# Patient Record
Sex: Female | Born: 1975 | Race: White | Hispanic: No | Smoking: Never smoker
Health system: Southern US, Community
[De-identification: ages and names within clinical notes are randomized; demographics above are authoritative.]

## PROBLEM LIST (undated history)

## (undated) DIAGNOSIS — IMO0002 Reserved for concepts with insufficient information to code with codable children: Secondary | ICD-10-CM

## (undated) DIAGNOSIS — G43909 Migraine, unspecified, not intractable, without status migrainosus: Secondary | ICD-10-CM

## (undated) HISTORY — DX: Migraine, unspecified, not intractable, without status migrainosus: G43.909

## (undated) HISTORY — PX: BREAST BIOPSY: SHX20

## (undated) HISTORY — DX: Reserved for concepts with insufficient information to code with codable children: IMO0002

---

## 2011-08-16 ENCOUNTER — Other Ambulatory Visit: Payer: Self-pay | Admitting: Orthopedic Surgery

## 2011-08-16 ENCOUNTER — Ambulatory Visit (HOSPITAL_BASED_OUTPATIENT_CLINIC_OR_DEPARTMENT_OTHER)
Admission: RE | Admit: 2011-08-16 | Discharge: 2011-08-16 | Disposition: A | Discharge status: Home / Self Care | Payer: BC Managed Care – PPO | Source: Ambulatory Visit | Attending: Orthopedic Surgery | Admitting: Orthopedic Surgery

## 2011-08-16 DIAGNOSIS — Z01812 Encounter for preprocedural laboratory examination: Secondary | ICD-10-CM | POA: Insufficient documentation

## 2011-08-16 DIAGNOSIS — L608 Other nail disorders: Secondary | ICD-10-CM | POA: Insufficient documentation

## 2011-08-16 DIAGNOSIS — G43909 Migraine, unspecified, not intractable, without status migrainosus: Secondary | ICD-10-CM | POA: Insufficient documentation

## 2011-08-16 DIAGNOSIS — K219 Gastro-esophageal reflux disease without esophagitis: Secondary | ICD-10-CM | POA: Insufficient documentation

## 2011-08-16 LAB — POCT HEMOGLOBIN-HEMACUE: Hemoglobin: 14.3 g/dL (ref 12.0–15.0)

## 2011-08-19 LAB — TISSUE CULTURE: Gram Stain: NONE SEEN

## 2011-08-19 LAB — WOUND CULTURE: Gram Stain: NONE SEEN

## 2011-08-19 NOTE — Op Note (Signed)
NAMEWILLOW, Garza NO.:  [id number]  MEDICAL RECORD NO.:  [id number]  LOCATION:                                 FACILITY:  PHYSICIAN:  Betha Loa, MD             DATE OF BIRTH:  DATE OF PROCEDURE:  08/16/2011 DATE OF DISCHARGE:                              OPERATIVE REPORT   PREOPERATIVE DIAGNOSIS:  Left thumb melanonychia.  POSTOP DIAGNOSIS:  Left thumb  melanonychia.  PROCEDURE:  Left thumb nail bed biopsy.  SURGEON:  Betha Loa, MD  ASSISTANT:  Cindee Salt, MD  ANESTHESIA:  General.  IV FLUIDS:  Per anesthesia flow sheet.  ESTIMATED BLOOD LOSS:  Minimal.  COMPLICATIONS:  None.  SPECIMENS:  Nail bed tissue to pathology.  CULTURES:  Nail plate to micro.  TOURNIQUET TIME:  16 minutes.  DISPOSITION:  Stable to PACU.  INDICATIONS:  Hannah Garza is a 35 year old left hand dominant female who has noted a 3-year history of discoloration of the left thumb nail. She was seen by Dermatology who referred her to me for nail bed biopsy. She has noted no changes in the nail other than the discoloration.  I discussed with Hannah Garza the potential causes of nail bed discoloration.  I recommended going to the operating room for a nail bed biopsy.  Risks, benefits, and alternatives of surgery were discussed including the risk of blood loss, infection, damage to nerves, vessels, tendons, ligaments, bone, failure of procedure, need for additional procedures, complications with wound healing, and nail deformity.  She voiced understanding of these risks and elected to proceed.  OPERATIVE COURSE:  After being identified preoperatively by myself, the patient and I agreed upon procedure and site of procedure.  The surgical site was marked.  The risks, benefits, and alternatives of surgery were reviewed and she wished to proceed.  Surgical consent has been signed. She was given 1 g of IV Ancef as preoperative antibiotic prophylaxis. She was transported  to the operating room and placed on the operating room table in supine position with the left upper extremity on arm board.  General anesthesia was induced by the anesthesiologist.  The left upper extremity was prepped and draped in normal sterile orthopedic fashion.  Surgical pause was performed between surgeons, anesthesia, and operating room staff, and all were in agreement as to the patient, procedure, and site of procedure.  Tourniquet proximal aspect of the extremity was inflated to 250 mmHg after exsanguination of the forearm and upper arm with an Esmarch bandage.  The hand was not wrapped.  The nail plate of the left thumb was removed using the Therapist, nutritional. There was discoloration to the nail plate itself.  There was a small area of approximately 1 x 3 mm of discoloration on the nail bed.  This did not go underneath the nail fold.  A Beaver blade was used to excise a longitudinal portion of the nail bed including discoloration.  This was sent to pathology for examination.  The nail plate and cultures were sent to micro for examination and for fungal culture.  The wound was copiously irrigated with sterile saline.  The nail bed was able to  be undermined and reapproximated.  A 6-0 chromic gut suture was used in an interrupted fashion to reapproximate the nail bed.  A piece of Xeroform was placed in the nail fold and the wound was dressed with sterile Xeroform.  It was then dressed with a sterile 2 x 2s and wrapped with Kling and a Coban dressing.  A digital block was performed with 10 mL of 0.25% plain Marcaine to aid in postoperative analgesia.  The tourniquet was deflated at 16 minutes.  Operative drapes were broken down, and the patient was awoken from anesthesia safely.  She was transferred back to the stretcher and taken to PACU in stable condition.  I will see her back in the office in 1 week for postoperative followup.  I will give her Percocet 5/325 1-2 p.o. q.6 h. p.r.n.  pain, dispensed #40.     Betha Loa, MD     KK/MEDQ  D:  08/16/2011  T:  08/16/2011  Job:  [id number]  Electronically Signed by Betha Loa  on 08/19/2011 10:28:40 AM

## 2011-08-21 LAB — ANAEROBIC CULTURE

## 2011-09-14 LAB — FUNGUS CULTURE W SMEAR: Fungal Smear: NONE SEEN

## 2011-09-28 LAB — AFB CULTURE WITH SMEAR (NOT AT ARMC)
Acid Fast Smear: NONE SEEN
Acid Fast Smear: NONE SEEN

## 2013-04-24 ENCOUNTER — Other Ambulatory Visit: Payer: Self-pay | Admitting: Obstetrics and Gynecology

## 2013-05-30 ENCOUNTER — Other Ambulatory Visit: Payer: Self-pay

## 2013-05-30 MED ORDER — TOPIRAMATE 50 MG PO TABS: ORAL_TABLET | ORAL | Status: DC

## 2013-05-30 MED ORDER — DICLOFENAC POTASSIUM(MIGRAINE) 50 MG PO PACK: 50.0000 mg | PACK | ORAL | Status: DC | PRN

## 2013-05-30 NOTE — Telephone Encounter (Signed)
Patient called requesting refills at her new pharmacy.

## 2014-02-27 ENCOUNTER — Telehealth: Payer: Self-pay | Admitting: *Deleted

## 2014-02-27 MED ORDER — TOPIRAMATE 50 MG PO TABS: ORAL_TABLET | ORAL | Status: DC

## 2014-02-27 NOTE — Telephone Encounter (Signed)
Rx has been sent, noting appt is needed.

## 2014-03-09 ENCOUNTER — Other Ambulatory Visit: Payer: Self-pay | Admitting: Diagnostic Neuroimaging

## 2014-05-22 ENCOUNTER — Ambulatory Visit (INDEPENDENT_AMBULATORY_CARE_PROVIDER_SITE_OTHER): Payer: BC Managed Care – PPO | Admitting: Diagnostic Neuroimaging

## 2014-05-22 ENCOUNTER — Encounter: Payer: Self-pay | Admitting: Diagnostic Neuroimaging

## 2014-05-22 ENCOUNTER — Encounter (INDEPENDENT_AMBULATORY_CARE_PROVIDER_SITE_OTHER): Payer: Self-pay

## 2014-05-22 VITALS — BP 122/85 | HR 77 | Temp 97.3°F | Ht 68.0 in | Wt 140.8 lb

## 2014-05-22 DIAGNOSIS — G43009 Migraine without aura, not intractable, without status migrainosus: Secondary | ICD-10-CM

## 2014-05-22 MED ORDER — TOPIRAMATE 50 MG PO TABS: 100.0000 mg | ORAL_TABLET | Freq: Two times a day (BID) | ORAL | Status: DC

## 2014-05-22 MED ORDER — DICLOFENAC POTASSIUM(MIGRAINE) 50 MG PO PACK: 50.0000 mg | PACK | ORAL | Status: DC | PRN

## 2014-05-22 NOTE — Patient Instructions (Signed)
Increase topiramate to 100mg twice a day. 

## 2014-05-22 NOTE — Progress Notes (Signed)
[facility]  PATIENT: Hannah Garza DOB: [birth year]  REFERRING CLINICIAN:  HISTORY FROM: patient (2 sons here for visit) REASON FOR VISIT: follow up   HISTORICAL  CHIEF COMPLAINT:  Chief Complaint  Patient presents with  . Follow-up    HA    HISTORY OF PRESENT ILLNESS:   UPDATE 05/22/14 (VRP): Since last visit, HA stable. Slightly worse in summer with hot weather. Overall having 4 HA per month. Uses cambia 1-2 packets per month. On TPX 50 / 100.   UPDATE 01/08/13 (VRP): Since last visit, doing slightly worse. Was better for a while, but lately incr HA up to 6 per month. Tried cambia sample which worked, but didn't get rx. More stress and insomnia lately.   UPDATE 08/27/12 (JM): Feels like headaches are better now having 2 headaches per month.  Tolerating topiramate 50mg  BID, 1st week had tingling and focus issues but improved just has tingling in fingers early in morning but not enough to have problems with "gripping" which improves later in the day. Also continues to take Excedrin migraine and Ibuprofen 2 days out of the month with relief.   Sumatriptan made her feel like her throat was closing, heart racing.      PRIOR HPI (06/11/12, VRP): 38 year old left-handed female with history of migraine, degenerative disc disease, here for evaluation of increasing headaches. Patient reports history of migraine headaches since teenage years. She describes 1-3 migraine headaches per year, consisting of left inner eye pain, spreading to the left forehead and left hemisphere, sometimes associated with photophobia, nausea, triggered by strong smells and loud sounds. She has family history of migraine headache in her mother. Over the past year, she's had increasing frequency and severity of headaches, now at least one migraine headache per week as well as a low level daily headache. Headaches seem to be worse right before her menstrual cycle. She's tried on oral contraceptive which  did not help. Typically she has tried ibuprofen, Tylenol, Excedrin Migraine, which previously helped her headaches, but no longer help. Patient also reports history of difficulty falling asleep as well as staying asleep. Ongoing psychosocial factors include mother-in-law and father-in-law who live with her since October 2011, as well as anticipated relocation to [place] in 2014.   REVIEW OF SYSTEMS: Full 14 system review of systems performed and notable only for headache insomnia blurred vision.  ALLERGIES: Allergies  Allergen Reactions  . Sumatriptan     HOME MEDICATIONS: Outpatient Prescriptions Prior to Visit  Medication Sig Dispense Refill  . Diclofenac Potassium (CAMBIA) 50 MG PACK Take 50 mg by mouth as needed.  9 each  6  . topiramate (TOPAMAX) 50 MG tablet One tab in the morning, two tabs in the evening  90 tablet  0  . topiramate (TOPAMAX) 50 MG tablet TAKE 1 TABLET BY MOUTH EVERY MORNING AND 2 TABLETS BY MOUTH EVERY EVENING  60 tablet  0   No facility-administered medications prior to visit.    PAST MEDICAL HISTORY: Past Medical History  Diagnosis Date  . Migraine   . DDD (degenerative disc disease)     PAST SURGICAL HISTORY: History reviewed. No pertinent past surgical history.  FAMILY HISTORY: Family History  Problem Relation Age of Onset  . Hypertension Mother   . Diabetes type II Father   . Hypertension Paternal Uncle     SOCIAL HISTORY:  History   Social History  . Marital Status: Married    Spouse Name: Nida Boatman    Number of Children:  2  . Years of Education: College   Occupational History  .  Other    Teacher Asst.    Social History Main Topics  . Smoking status: Never Smoker   . Smokeless tobacco: Never Used  . Alcohol Use: No  . Drug Use: No  . Sexual Activity: Not on file   Other Topics Concern  . Not on file   Social History Narrative   Patient lives at home with family.   Caffeine Use: occasionally     PHYSICAL EXAM  Filed Vitals:    05/22/14 1245  BP: 122/85  Pulse: 77  Temp: 97.3 F (36.3 C)  TempSrc: Oral  Height: 5\' 8"  (1.727 m)  Weight: 140 lb 12.8 oz (63.866 kg)    Not recorded    Body mass index is 21.41 kg/(m^2).  GENERAL EXAM: Patient is in no distress; well developed, nourished and groomed; neck is supple  CARDIOVASCULAR: Regular rate and rhythm, no murmurs, no carotid bruits  NEUROLOGIC: MENTAL STATUS: awake, alert, language fluent, comprehension intact, naming intact, fund of knowledge appropriate CRANIAL NERVE: no papilledema on fundoscopic exam, pupils equal and reactive to light, visual fields full to confrontation, extraocular muscles intact, no nystagmus, facial sensation and strength symmetric, hearing intact, palate elevates symmetrically, uvula midline, shoulder shrug symmetric, tongue midline. MOTOR: normal bulk and tone, full strength in the BUE, BLE SENSORY: normal and symmetric to light touch  COORDINATION: finger-nose-finger, fine finger movements normal REFLEXES: deep tendon reflexes present and symmetric GAIT/STATION: narrow based gait; able to walk tandem; romberg is negative    DIAGNOSTIC DATA (LABS, IMAGING, TESTING) - I reviewed patient records, labs, notes, testing and imaging myself where available.  Lab Results  Component Value Date   HGB 14.3 08/16/2011   No results found for this basename: na, k, cl, co2, glucose, bun, creatinine, calcium, prot, albumin, ast, alt, alkphos, bilitot, gfrnonaa, gfraa   No results found for this basename: CHOL, HDL, LDLCALC, LDLDIRECT, TRIG, CHOLHDL   No results found for this basename: HGBA1C   No results found for this basename: VITAMINB12   No results found for this basename: TSH    06/20/12 MRI brain - normal    ASSESSMENT AND PLAN  38 y.o. year old female here with migraine without aura.  PLAN: 1. increase TPX to 100mg  BID 2. Cambia prn breakthrough HA (allergic to triptans) 3. may consider amitriptyline in future  (due to anxiety, insomnia, HA)  Meds ordered this encounter  Medications  . topiramate (TOPAMAX) 50 MG tablet    Sig: Take 2 tablets (100 mg total) by mouth 2 (two) times daily.    Dispense:  120 tablet    Refill:  12  . Diclofenac Potassium (CAMBIA) 50 MG PACK    Sig: Take 50 mg by mouth as needed.    Dispense:  9 each    Refill:  6   Return in about 6 months (around 11/22/2014).    Suanne MarkerVIKRAM R. PENUMALLI, MD 05/22/2014, 1:47 PM Certified in Neurology, Neurophysiology and Neuroimaging  [facility] [address], [address] [place], [place] [address] [phone]

## 2014-06-16 ENCOUNTER — Other Ambulatory Visit: Payer: Self-pay | Admitting: Obstetrics and Gynecology

## 2014-06-17 LAB — CYTOLOGY - PAP

## 2014-11-26 ENCOUNTER — Ambulatory Visit: Payer: BC Managed Care – PPO | Admitting: Diagnostic Neuroimaging

## 2015-02-11 ENCOUNTER — Ambulatory Visit (INDEPENDENT_AMBULATORY_CARE_PROVIDER_SITE_OTHER): Payer: BLUE CROSS/BLUE SHIELD | Admitting: Diagnostic Neuroimaging

## 2015-02-11 ENCOUNTER — Encounter: Payer: Self-pay | Admitting: Diagnostic Neuroimaging

## 2015-02-11 VITALS — BP 128/90 | HR 70 | Ht 68.0 in | Wt 136.2 lb

## 2015-02-11 DIAGNOSIS — G43009 Migraine without aura, not intractable, without status migrainosus: Secondary | ICD-10-CM | POA: Diagnosis not present

## 2015-02-11 MED ORDER — TOPIRAMATE 50 MG PO TABS: 100.0000 mg | ORAL_TABLET | Freq: Two times a day (BID) | ORAL | Status: DC

## 2015-02-11 MED ORDER — DICLOFENAC POTASSIUM(MIGRAINE) 50 MG PO PACK: 50.0000 mg | PACK | ORAL | Status: AC | PRN

## 2015-02-11 NOTE — Patient Instructions (Signed)
Continue current medications. 

## 2015-02-11 NOTE — Progress Notes (Signed)
[facility]  PATIENT: Hannah Garza DOB: 1976-01-12  REFERRING CLINICIAN:  HISTORY FROM: patient REASON FOR VISIT: follow up   HISTORICAL  CHIEF COMPLAINT:  Chief Complaint  Patient presents with  . Follow-up    migraine    HISTORY OF PRESENT ILLNESS:   UPDATE 02/11/15 (VRP): Since last visit, was doing better on TPX  BID, but now going through divorce and custody battle since January 07, 2015, and subsequent increase in headaches.   UPDATE 05/22/14 (VRP): Since last visit, HA stable. Slightly worse in summer with hot weather. Overall having 4 HA per month. Uses cambia 1-2 packets per month. On TPX 50 / 100.   UPDATE 01/08/13 (VRP): Since last visit, doing slightly worse. Was better for a while, but lately incr HA up to 6 per month. Tried cambia sample which worked, but didn't get rx. More stress and insomnia lately.   UPDATE 08/27/12 (JM): Feels like headaches are better now having 2 headaches per month.  Tolerating topiramate  BID, 1st week had tingling and focus issues but improved just has tingling in fingers early in morning but not enough to have problems with "gripping" which improves later in the day. Also continues to take Excedrin migraine and Ibuprofen 2 days out of the month with relief.   Sumatriptan made her feel like her throat was closing, heart racing.      PRIOR HPI (06/11/12, VRP): 39 year old left-handed female with history of migraine, degenerative disc disease, here for evaluation of increasing headaches. Patient reports history of migraine headaches since teenage years. She describes 1-3 migraine headaches per year, consisting of left inner eye pain, spreading to the left forehead and left hemisphere, sometimes associated with photophobia, nausea, triggered by strong smells and loud sounds. She has family history of migraine headache in her mother. Over the past year, she's had increasing frequency and severity of headaches, now at least  one migraine headache per week as well as a low level daily headache. Headaches seem to be worse right before her menstrual cycle. She's tried on oral contraceptive which did not help. Typically she has tried ibuprofen, Tylenol, Excedrin Migraine, which previously helped her headaches, but no longer help. Patient also reports history of difficulty falling asleep as well as staying asleep. Ongoing psychosocial factors include mother-in-law and father-in-law who live with her since October 2011, as well as anticipated relocation to [place] in 2014.   REVIEW OF SYSTEMS: Full 14 system review of systems performed and notable only for agitation decr concentration depression insomnia freq waking.    ALLERGIES: Allergies  Allergen Reactions  . Sumatriptan     HOME MEDICATIONS: Outpatient Prescriptions Prior to Visit  Medication Sig Dispense Refill  . Diclofenac Potassium (CAMBIA) 50 MG PACK Take 50 mg by mouth as needed. 9 each 6  . topiramate (TOPAMAX) 50 MG tablet Take 2 tablets (100 mg total) by mouth 2 (two) times daily. 120 tablet 12   No facility-administered medications prior to visit.    PAST MEDICAL HISTORY: Past Medical History  Diagnosis Date  . Migraine   . DDD (degenerative disc disease)     PAST SURGICAL HISTORY: History reviewed. No pertinent past surgical history.  FAMILY HISTORY: Family History  Problem Relation Age of Onset  . Hypertension Mother   . Diabetes type II Father   . Hypertension Paternal Uncle     SOCIAL HISTORY:  History   Social History  . Marital Status: Married    Spouse Name: Nida Boatman  .  Number of Children: 2  . Years of Education: College   Occupational History  .  Other    Teacher Asst.    Social History Main Topics  . Smoking status: Never Smoker   . Smokeless tobacco: Never Used  . Alcohol Use: No  . Drug Use: No  . Sexual Activity: Not on file   Other Topics Concern  . Not on file   Social History Narrative   Patient lives at  home with family.   Caffeine Use: occasionally     PHYSICAL EXAM  Filed Vitals:   02/11/15 1518  BP: 128/90  Pulse: 70  Height: 5\' 8"  (1.727 m)  Weight: 136 lb 3.2 oz (61.78 kg)    Not recorded      Body mass index is 20.71 kg/(m^2).  GENERAL EXAM: Patient is in no distress; well developed, nourished and groomed; neck is supple; TEARFUL  CARDIOVASCULAR: Regular rate and rhythm, no murmurs, no carotid bruits  NEUROLOGIC: MENTAL STATUS: awake, alert, language fluent, comprehension intact, naming intact, fund of knowledge appropriate CRANIAL NERVE: no papilledema on fundoscopic exam, pupils equal and reactive to light, visual fields full to confrontation, extraocular muscles intact, no nystagmus, facial sensation and strength symmetric, hearing intact, palate elevates symmetrically, uvula midline, shoulder shrug symmetric, tongue midline. MOTOR: normal bulk and tone, full strength in the BUE, BLE SENSORY: normal and symmetric to light touch  COORDINATION: finger-nose-finger, fine finger movements normal REFLEXES: deep tendon reflexes present and symmetric GAIT/STATION: narrow based gait     DIAGNOSTIC DATA (LABS, IMAGING, TESTING) - I reviewed patient records, labs, notes, testing and imaging myself where available.  Lab Results  Component Value Date   HGB 14.3 08/16/2011   No results found for: NA No results found for: CHOL No results found for: HGBA1C No results found for: VITAMINB12 No results found for: TSH  06/20/12 MRI brain - normal    ASSESSMENT AND PLAN  39 y.o. year old female here with migraine without aura.  PLAN: 1. Continue TPX 100mg  BID 2. Continue Cambia prn breakthrough HA (allergic to triptans)  Return in about 6 months (around 08/13/2015).    Suanne MarkerVIKRAM R. PENUMALLI, MD 02/11/2015, 4:02 PM Certified in Neurology, Neurophysiology and Neuroimaging  [facility] [address], [address] [place], [place] [address] [phone]  [phone]

## 2015-05-24 ENCOUNTER — Other Ambulatory Visit: Payer: Self-pay | Admitting: Diagnostic Neuroimaging

## 2015-07-23 ENCOUNTER — Other Ambulatory Visit: Payer: Self-pay | Admitting: Obstetrics and Gynecology

## 2015-07-24 LAB — CYTOLOGY - PAP

## 2016-02-22 ENCOUNTER — Other Ambulatory Visit: Payer: Self-pay | Admitting: Diagnostic Neuroimaging

## 2016-08-22 ENCOUNTER — Other Ambulatory Visit: Payer: Self-pay | Admitting: Obstetrics and Gynecology

## 2016-08-22 DIAGNOSIS — R928 Other abnormal and inconclusive findings on diagnostic imaging of breast: Secondary | ICD-10-CM

## 2016-08-26 ENCOUNTER — Ambulatory Visit
Admission: RE | Admit: 2016-08-26 | Discharge: 2016-08-26 | Disposition: A | Discharge status: Home / Self Care | Payer: BC Managed Care – PPO | Source: Ambulatory Visit | Attending: Obstetrics and Gynecology | Admitting: Obstetrics and Gynecology

## 2016-08-26 DIAGNOSIS — R928 Other abnormal and inconclusive findings on diagnostic imaging of breast: Secondary | ICD-10-CM

## 2016-09-07 ENCOUNTER — Other Ambulatory Visit: Payer: Self-pay | Admitting: Obstetrics and Gynecology

## 2016-09-07 DIAGNOSIS — N6310 Unspecified lump in the right breast, unspecified quadrant: Secondary | ICD-10-CM

## 2016-09-15 ENCOUNTER — Ambulatory Visit
Admission: RE | Admit: 2016-09-15 | Discharge: 2016-09-15 | Disposition: A | Discharge status: Home / Self Care | Payer: BC Managed Care – PPO | Source: Ambulatory Visit | Attending: Obstetrics and Gynecology | Admitting: Obstetrics and Gynecology

## 2016-09-15 DIAGNOSIS — N6310 Unspecified lump in the right breast, unspecified quadrant: Secondary | ICD-10-CM

## 2020-08-11 ENCOUNTER — Other Ambulatory Visit: Payer: Self-pay | Admitting: Obstetrics and Gynecology

## 2020-08-11 DIAGNOSIS — R928 Other abnormal and inconclusive findings on diagnostic imaging of breast: Secondary | ICD-10-CM

## 2020-08-19 ENCOUNTER — Other Ambulatory Visit: Payer: Self-pay

## 2020-08-19 ENCOUNTER — Ambulatory Visit
Admission: RE | Admit: 2020-08-19 | Discharge: 2020-08-19 | Disposition: A | Discharge status: Home / Self Care | Payer: BC Managed Care – PPO | Source: Ambulatory Visit | Attending: Obstetrics and Gynecology | Admitting: Obstetrics and Gynecology

## 2020-08-19 ENCOUNTER — Other Ambulatory Visit: Payer: Self-pay | Admitting: Obstetrics and Gynecology

## 2020-08-19 DIAGNOSIS — N6320 Unspecified lump in the left breast, unspecified quadrant: Secondary | ICD-10-CM

## 2020-08-19 DIAGNOSIS — R928 Other abnormal and inconclusive findings on diagnostic imaging of breast: Secondary | ICD-10-CM

## 2020-09-01 ENCOUNTER — Ambulatory Visit
Admission: RE | Admit: 2020-09-01 | Discharge: 2020-09-01 | Disposition: A | Discharge status: Home / Self Care | Payer: BC Managed Care – PPO | Source: Ambulatory Visit | Attending: Obstetrics and Gynecology | Admitting: Obstetrics and Gynecology

## 2020-09-01 ENCOUNTER — Other Ambulatory Visit: Payer: Self-pay

## 2020-09-01 DIAGNOSIS — N6320 Unspecified lump in the left breast, unspecified quadrant: Secondary | ICD-10-CM

## 2022-09-25 IMAGING — MG MM DIGITAL DIAGNOSTIC UNILAT*L* W/ TOMO W/ CAD
4 series · 4 of 12 positions shown · non-contrast
Comparison: Previous exam(s).

CLINICAL DATA: Screening recall for a possible left breast mass.

EXAM:
DIGITAL DIAGNOSTIC LEFT MAMMOGRAM WITH CAD AND TOMO
ULTRASOUND LEFT BREAST

[L MLO synth-2D]
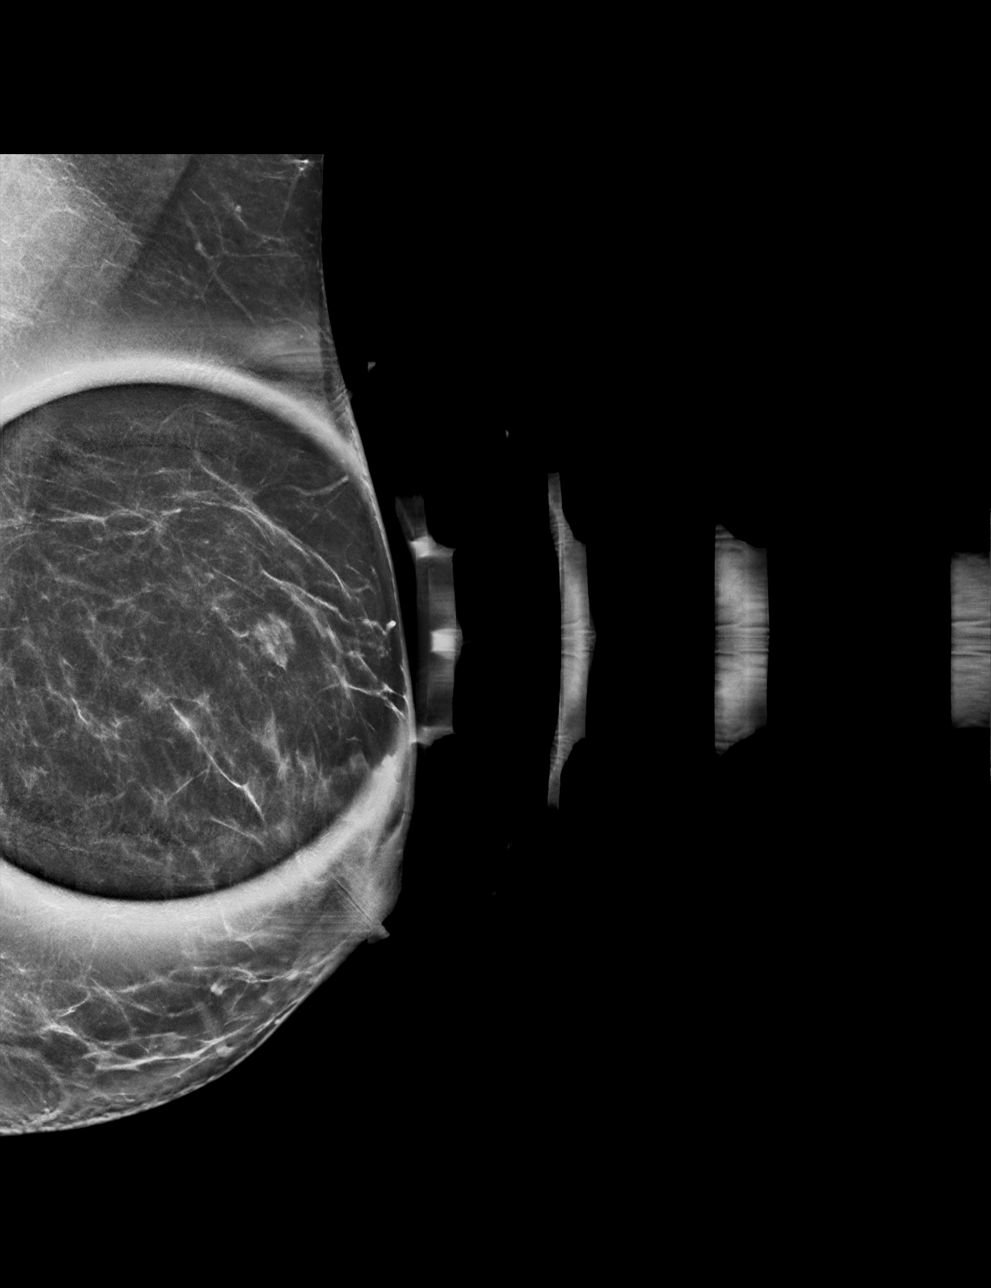

[L CC synth-2D]
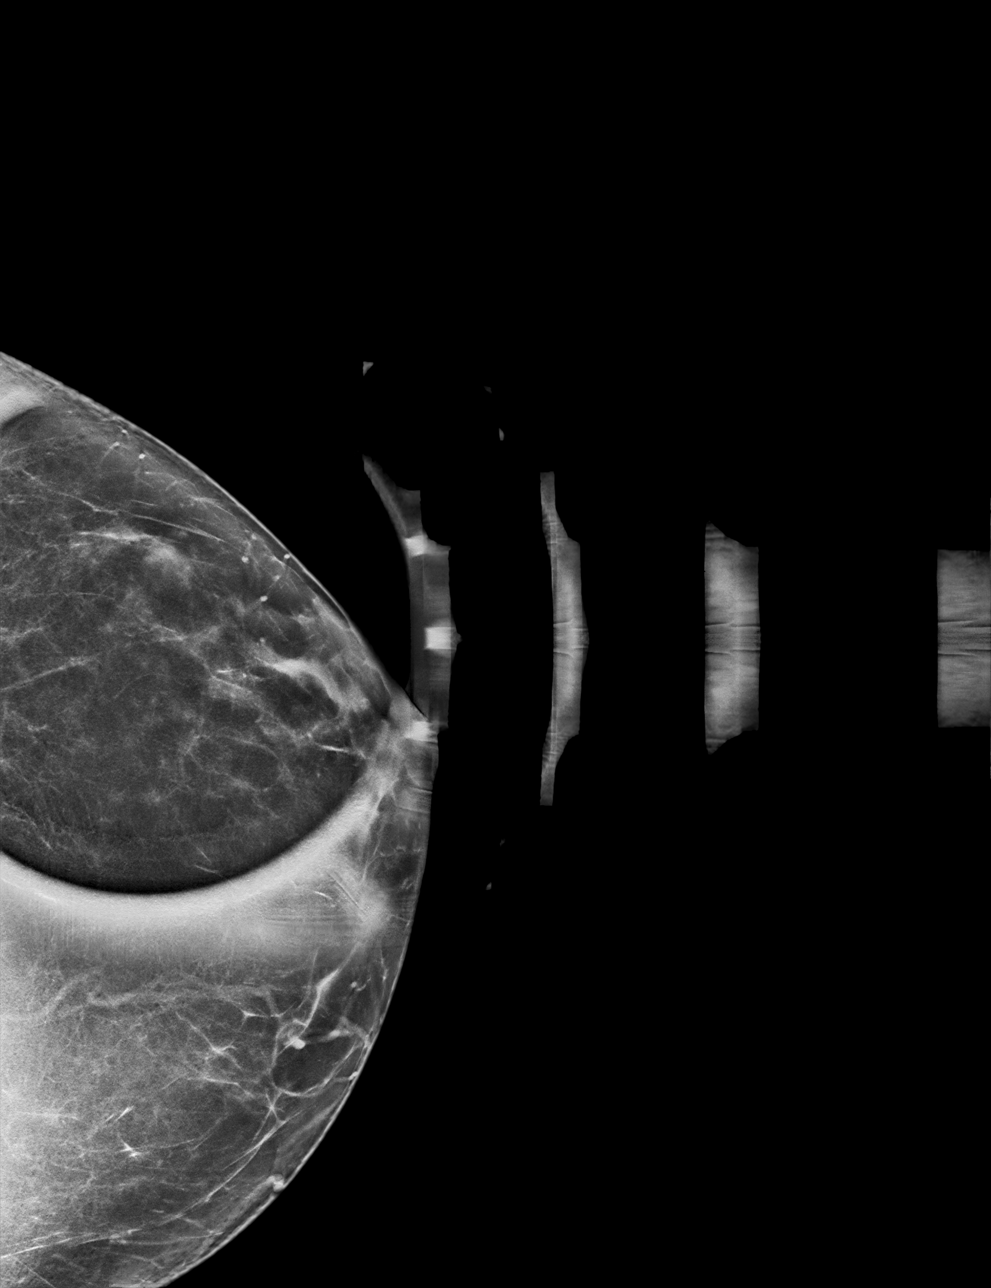

[L CC tomo · tomo slice 34/67.0]
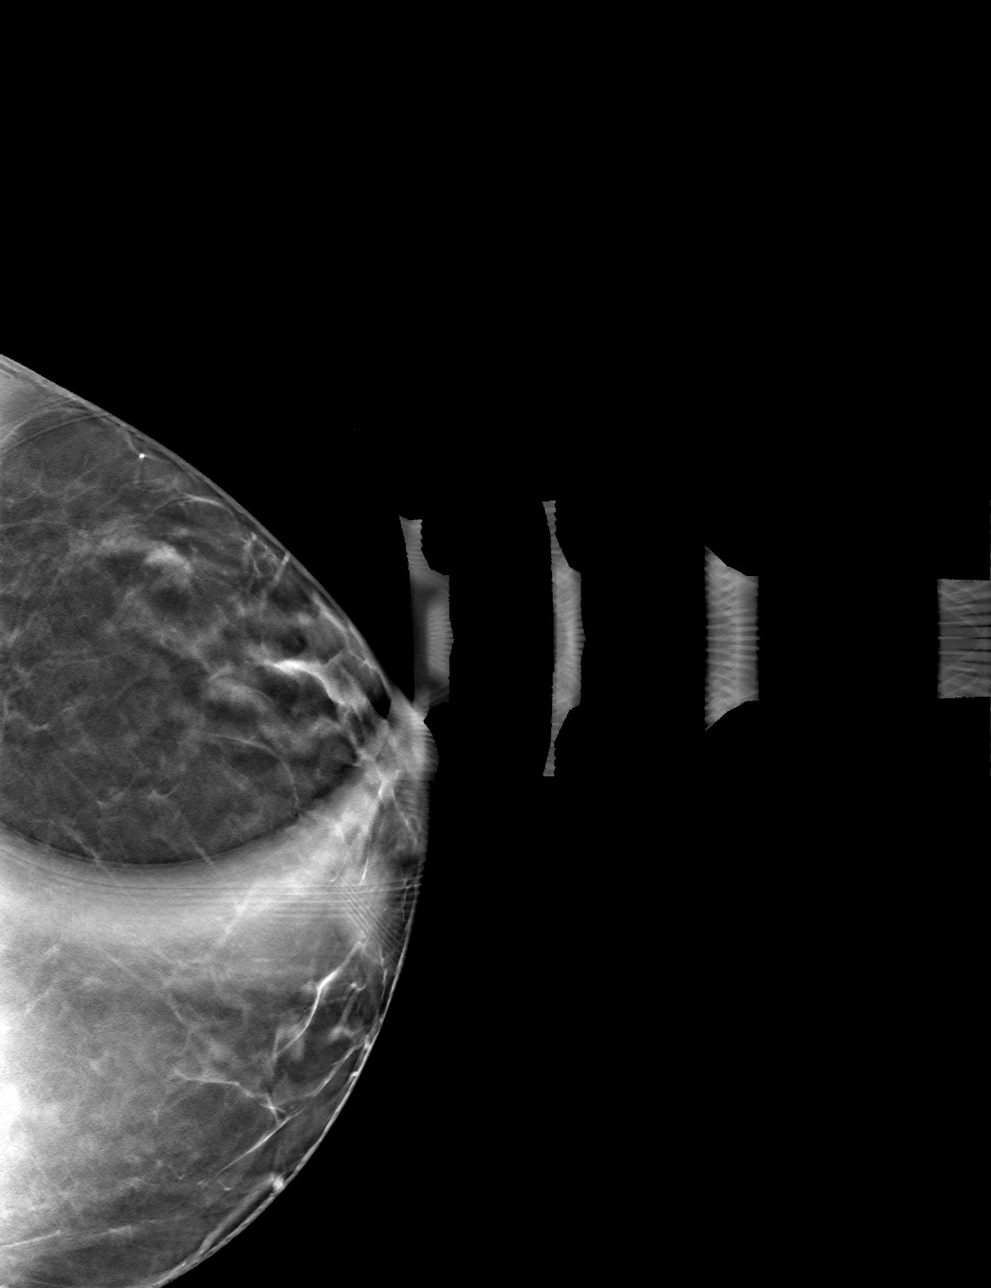

[L MLO tomo · tomo slice 37/74.0]
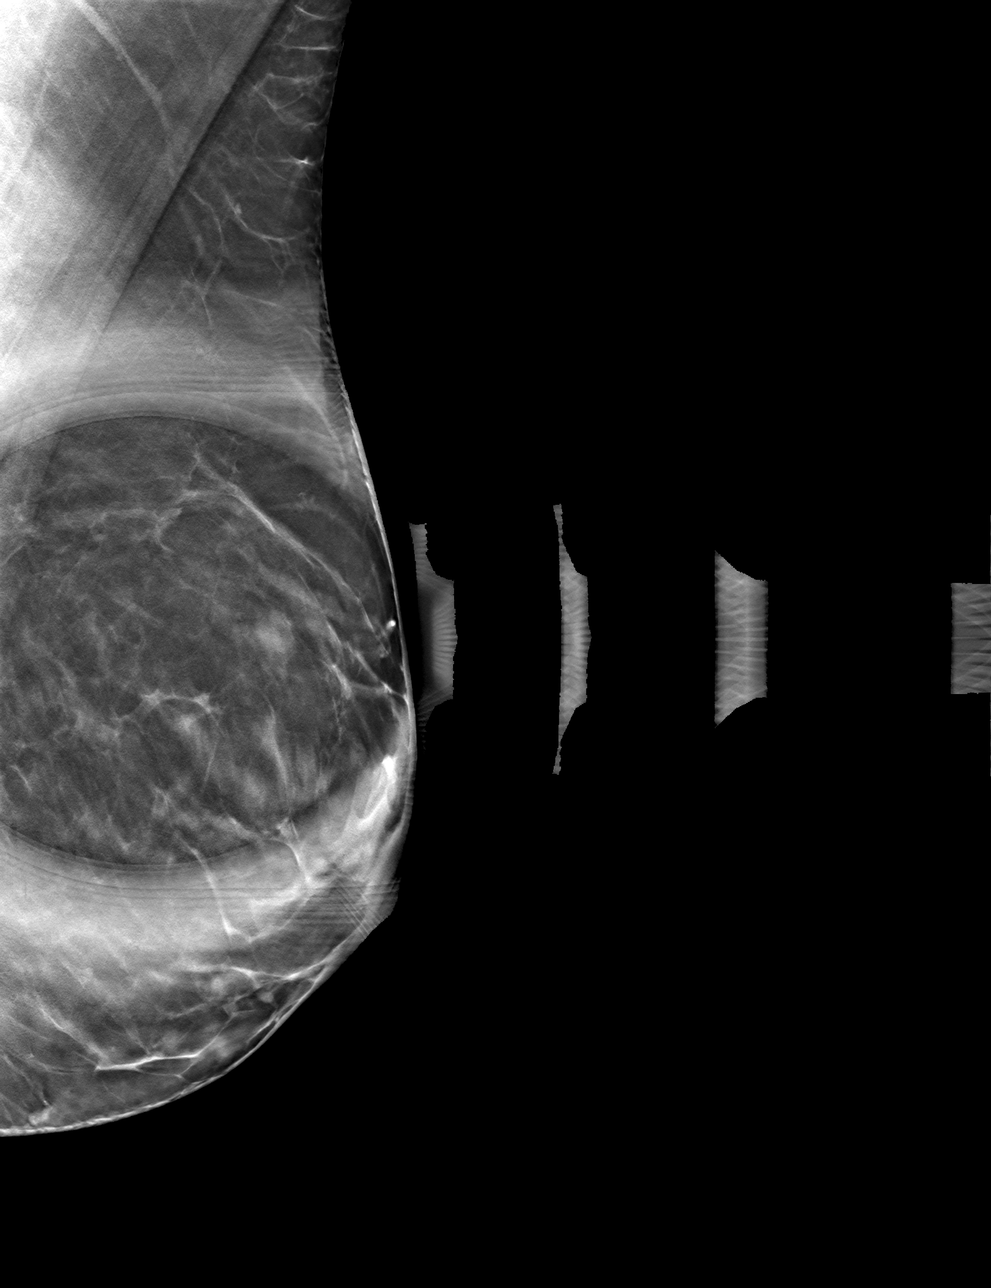

[4 of 12 positions shown; findings below may reference images not displayed]

ACR Breast Density Category b: There are scattered areas of
fibroglandular density.
FINDINGS: On the diagnostic images, the possible mass the lateral left breast
persists, but appears as a mixed attenuation mass, poorly defined on
the spot-compression cc view, better defined on the spot-compression
MLO view. The lesion measures approximately 9 mm in long axis.

Mammographic images were processed with CAD.

On physical exam, no mass is palpated in the lateral or upper outer
left breast.

Targeted ultrasound is performed, showing a focal lesion of mixed
echogenicity, which appears to be a focal area of mildly dilated
ducts with intervening hyperechoic tissue. This lies at 3 o'clock, 2
cm the nipple, middle depth, measuring approximately 10 x 4 x 7 mm
in size. This is consistent in size, shape and location to the
mammographic abnormality.

Sonographic evaluation of the left axilla shows no enlarged or
abnormal lymph nodes.
IMPRESSION: 1. Indeterminate, 1 cm, focal lesion in the 3 o'clock position of
the left breast, which has an appearance consistent with a focal
area mildly dilated ducts or a fibrocystic lesion. Malignancy is not
excluded.

RECOMMENDATION:
1. Ultrasound-guided core needle biopsy of the lesion at 3 o'clock
in the left breast. This procedure was scheduled prior to the
patient being discharged from the [REDACTED].

I have discussed the findings and recommendations with the patient.
If applicable, a reminder letter will be sent to the patient
regarding the next appointment.

BI-RADS CATEGORY  4: Suspicious.

## 2022-10-08 IMAGING — US US BREAST BX W LOC DEV 1ST LESION IMG BX SPEC US GUIDE*L*
1 series · 10 of 10 positions shown · non-contrast
Comparison: Previous exam(s).
COMPARISON: Previous exam(s).

Addendum:
CLINICAL DATA: 44-year-old female with an indeterminate left breast
mass.

EXAM:
ULTRASOUND GUIDED LEFT BREAST CORE NEEDLE BIOPSY

[Series 1: us breast bx w loc dev 1st lesion img bx spec us g · 0.06mm/px · 10 of 10 slices shown]
[im 1/10]
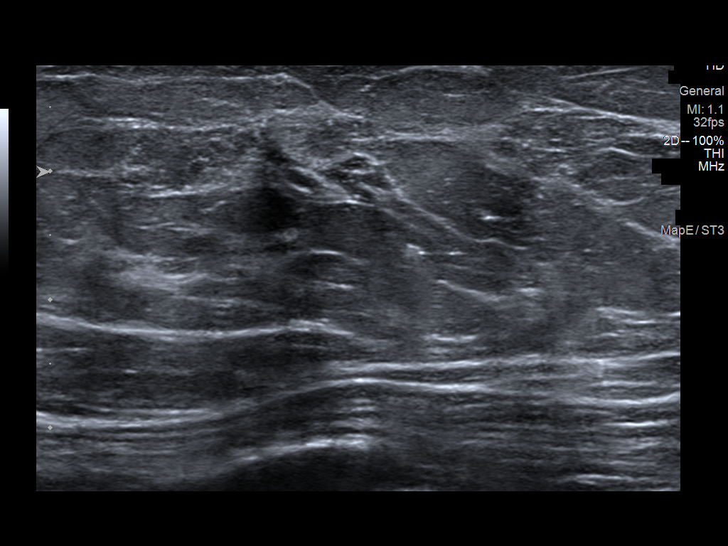
[im 2/10]
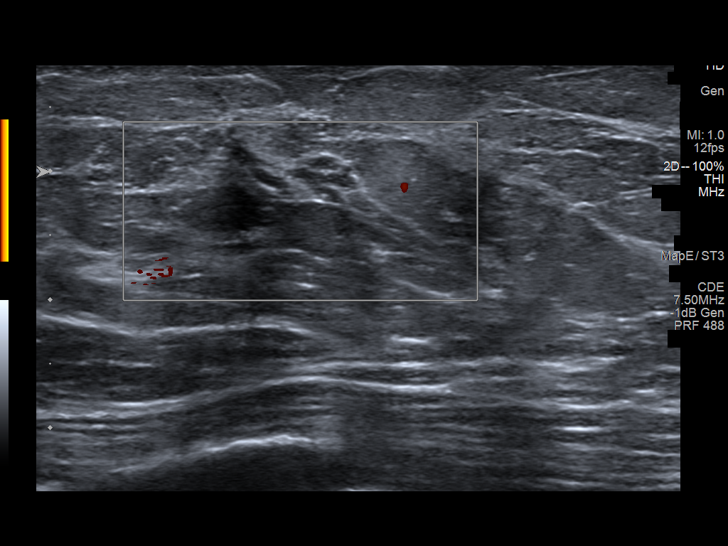
[im 3/10]
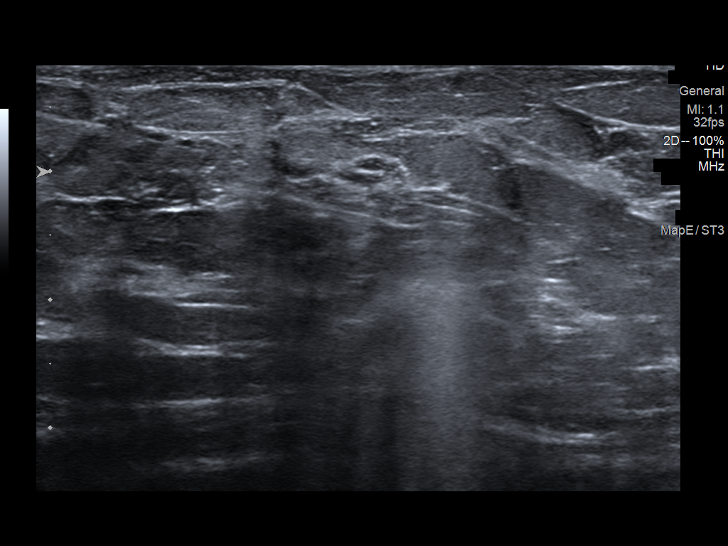
[im 4/10]
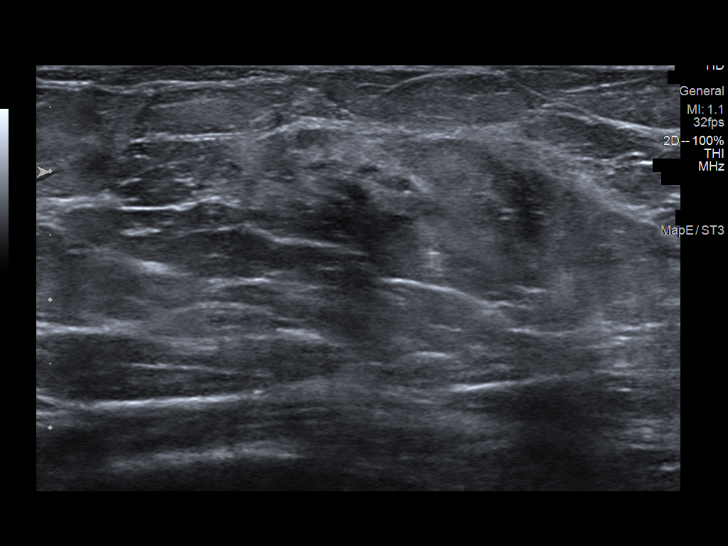
[im 5/10]
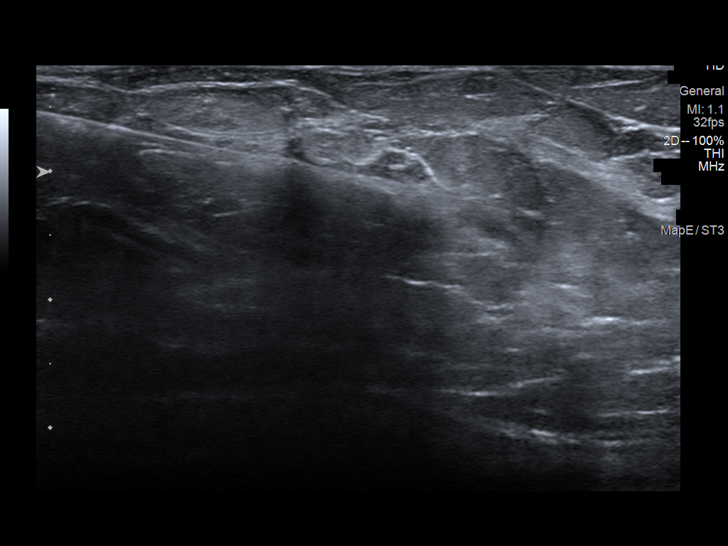
[im 6/10]
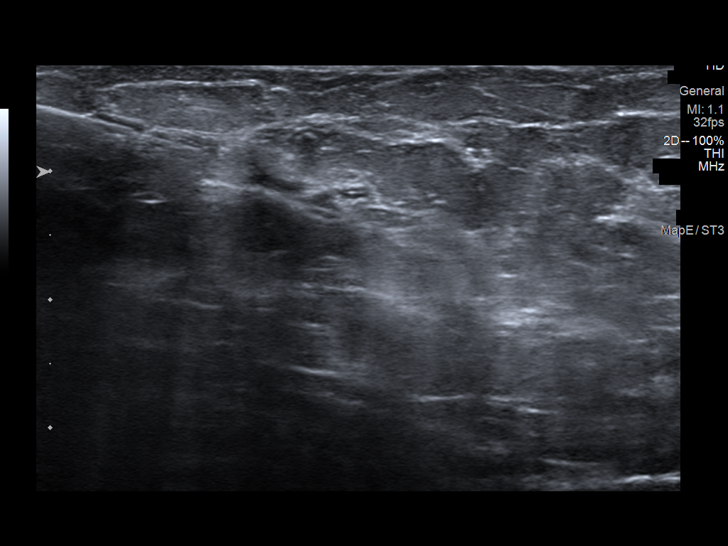
[im 7/10]
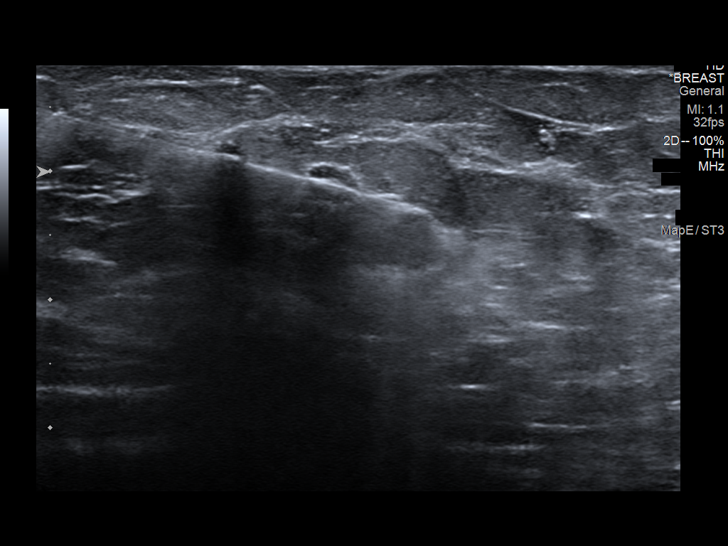
[im 8/10]
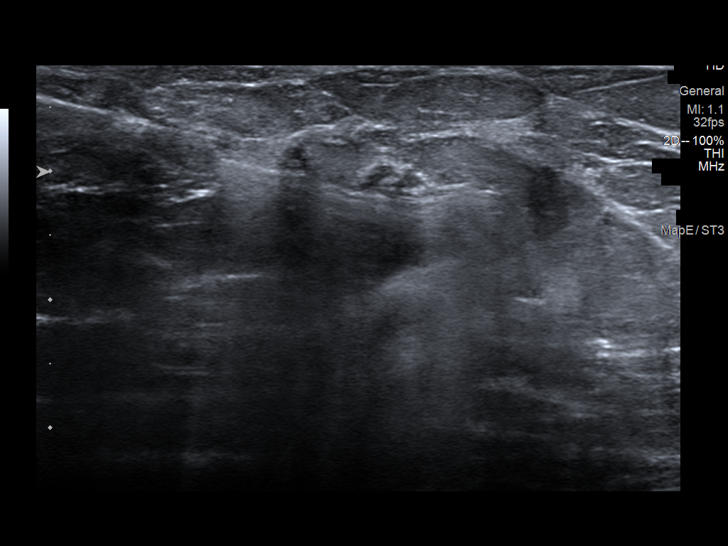
[im 9/10]
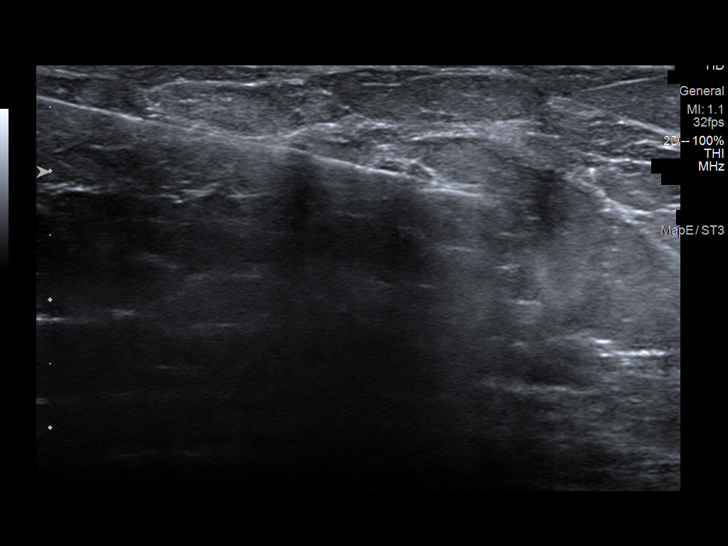
[im 10/10]
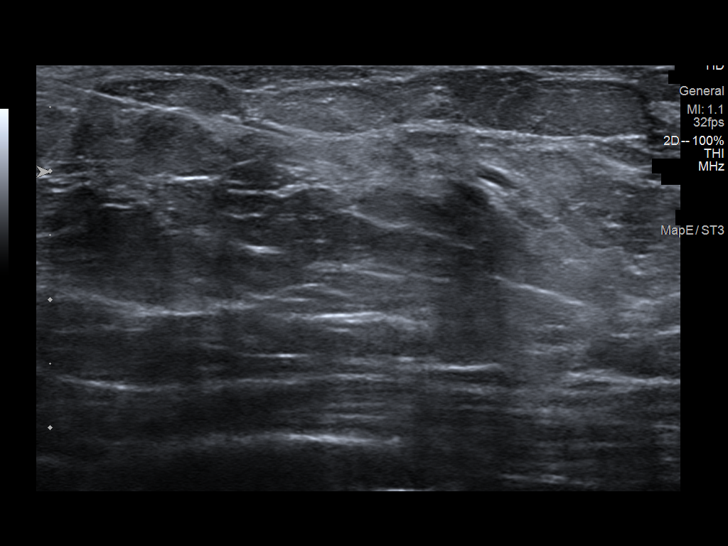

[10 of 10 positions shown; findings below may reference images not displayed]



Lesion quadrant: Upper outer quadrant

Using sterile technique and 1% Lidocaine as local anesthetic, under
direct ultrasound visualization, a 12 gauge Boland device was
used to perform biopsy of a heterogenous mass at the 3 o'clock
position using a inferior approach. At the conclusion of the
procedure ribbon shaped tissue marker clip was deployed into the
biopsy cavity. Follow up 2 view mammogram was performed and dictated
separately.
IMPRESSION: Ultrasound guided biopsy of the left breast. No apparent
complications.

ADDENDUM:
Pathology revealed COLUMNAR CELL CHANGES AND PSEUDOANGIOMATOUS
STROMAL HYPERPLASIA of the Left breast, upper outer. This was found
to be concordant by Dr. Rudi Jumper.

Pathology results were discussed with the patient by telephone. The
patient reported doing well after the biopsy with tenderness at the
site. Post biopsy instructions and care were reviewed and questions
were answered. The patient was encouraged to call The [REDACTED]

The patient was instructed to return for annual screening

Pathology results reported by Hecor Adhemar, RN on 09/02/2020.



Lesion quadrant: Upper outer quadrant

Using sterile technique and 1% Lidocaine as local anesthetic, under
direct ultrasound visualization, a 12 gauge Boland device was
used to perform biopsy of a heterogenous mass at the 3 o'clock
position using a inferior approach. At the conclusion of the
procedure ribbon shaped tissue marker clip was deployed into the
biopsy cavity. Follow up 2 view mammogram was performed and dictated
separately.
IMPRESSION: Ultrasound guided biopsy of the left breast. No apparent
complications.

## 2023-01-15 LAB — EXTERNAL GENERIC LAB PROCEDURE

## 2023-12-27 ENCOUNTER — Other Ambulatory Visit: Payer: Self-pay | Admitting: Obstetrics and Gynecology

## 2023-12-27 DIAGNOSIS — R928 Other abnormal and inconclusive findings on diagnostic imaging of breast: Secondary | ICD-10-CM

## 2024-01-01 ENCOUNTER — Encounter: Payer: Self-pay | Admitting: Obstetrics and Gynecology

## 2024-01-01 ENCOUNTER — Ambulatory Visit
Admission: RE | Admit: 2024-01-01 | Discharge: 2024-01-01 | Discharge status: Home / Self Care | Payer: 59 | Source: Ambulatory Visit | Attending: Obstetrics and Gynecology | Admitting: Obstetrics and Gynecology

## 2024-01-01 ENCOUNTER — Ambulatory Visit
Admission: RE | Admit: 2024-01-01 | Discharge: 2024-01-01 | Disposition: A | Discharge status: Home / Self Care | Payer: 59 | Source: Ambulatory Visit | Attending: Obstetrics and Gynecology | Admitting: Obstetrics and Gynecology

## 2024-01-01 DIAGNOSIS — R928 Other abnormal and inconclusive findings on diagnostic imaging of breast: Secondary | ICD-10-CM
